# Patient Record
Sex: Female | Born: 1950 | Race: Black or African American | Hispanic: No | Marital: Married | Smoking: Former smoker
Health system: Southern US, Community
[De-identification: ages and names within clinical notes are randomized; demographics above are authoritative.]

## PROBLEM LIST (undated history)

## (undated) DIAGNOSIS — I10 Essential (primary) hypertension: Secondary | ICD-10-CM

## (undated) DIAGNOSIS — E785 Hyperlipidemia, unspecified: Secondary | ICD-10-CM

## (undated) DIAGNOSIS — M25519 Pain in unspecified shoulder: Secondary | ICD-10-CM

## (undated) HISTORY — DX: Pain in unspecified shoulder: M25.519

## (undated) HISTORY — PX: TUBAL LIGATION: SHX77

## (undated) HISTORY — DX: Essential (primary) hypertension: I10

## (undated) HISTORY — DX: Hyperlipidemia, unspecified: E78.5

---

## 2013-06-06 ENCOUNTER — Telehealth (HOSPITAL_COMMUNITY): Payer: Self-pay | Admitting: *Deleted

## 2013-06-21 ENCOUNTER — Telehealth (HOSPITAL_COMMUNITY): Payer: Self-pay | Admitting: *Deleted

## 2013-07-05 ENCOUNTER — Telehealth (HOSPITAL_COMMUNITY): Payer: Self-pay | Admitting: *Deleted

## 2013-07-26 ENCOUNTER — Telehealth (HOSPITAL_COMMUNITY): Payer: Self-pay | Admitting: *Deleted

## 2013-09-15 ENCOUNTER — Encounter: Payer: Self-pay | Admitting: Cardiovascular Disease

## 2013-09-15 ENCOUNTER — Ambulatory Visit (INDEPENDENT_AMBULATORY_CARE_PROVIDER_SITE_OTHER): Payer: Self-pay | Admitting: Cardiovascular Disease

## 2013-09-15 VITALS — BP 126/77 | HR 90 | Ht 67.0 in | Wt 223.0 lb

## 2013-09-15 DIAGNOSIS — R079 Chest pain, unspecified: Secondary | ICD-10-CM

## 2013-09-15 DIAGNOSIS — R011 Cardiac murmur, unspecified: Secondary | ICD-10-CM

## 2013-09-15 DIAGNOSIS — R0989 Other specified symptoms and signs involving the circulatory and respiratory systems: Secondary | ICD-10-CM

## 2013-09-15 DIAGNOSIS — I10 Essential (primary) hypertension: Secondary | ICD-10-CM

## 2013-09-15 DIAGNOSIS — I119 Hypertensive heart disease without heart failure: Secondary | ICD-10-CM

## 2013-09-15 DIAGNOSIS — E669 Obesity, unspecified: Secondary | ICD-10-CM

## 2013-09-15 DIAGNOSIS — E785 Hyperlipidemia, unspecified: Secondary | ICD-10-CM

## 2013-09-15 DIAGNOSIS — R0609 Other forms of dyspnea: Secondary | ICD-10-CM

## 2013-09-15 NOTE — Patient Instructions (Addendum)
Your physician has requested that you have an echocardiogram. Echocardiography is a painless test that uses sound waves to create images of your heart. It provides your doctor with information about the size and shape of your heart and how well your heart's chambers and valves are working. This procedure takes approximately one hour. There are no restrictions for this procedure.  Your physician has requested that you have a lexiscan myoview. For further information please visit [email or web address]. Please follow instruction sheet, as given.   Your physician recommends that you return for lab work fasting.  Your physician has recommended you make the following change in your medication: start taking (1) 81 mg  aspirin daily.   Your physician recommends that you schedule a follow-up appointment in: 8 weeks

## 2013-09-15 NOTE — Progress Notes (Signed)
Patient ID: Vickie Padilla, female   DOB: 03/08/1951, 63 y.o.   MRN: [id number]     PATIENT PROFILE: Vickie Padilla is a 63 y.o. female who is referred through the courtesy of Dr. Lerry Linerwight Williams, for evaluation of left shoulder, arm, chest discomfort, as well as exertional shortness of breath, hypertension, and hyperlipidemia.   HPI:  Vickie Padilla is a 63 y.o. female who is originally from [place]  She admits to at least a 20 year history of hypertension.  Most recently she has been on amlodipine 5 mg, HCTZ 12.5 mg, and lisinopril 10 mg for blood pressure control.  She also has a history of hyperlipidemia and has been on simvastatin 20 mg.  Recently, she's developed left-sided chest shoulder, neck discomfort.  This can occur at any time, but also with activity.  She has noticed a change in her exercise tolerance with shortness of breath.  She does walk with a cane.  She is now referred for cardiology evaluation.  Family history is notable for hypertension and diabetes mellitus.  Past Medical History  Diagnosis Date  . Hyperlipidemia   . Hypertension   . Shoulder pain     Past Surgical History  Procedure Laterality Date  . Tubal ligation      No Known Allergies  Current Outpatient Prescriptions  Medication Sig Dispense Refill  . amLODipine (NORVASC) 5 MG tablet Take 5 mg by mouth daily.      . cyclobenzaprine (FLEXERIL) 10 MG tablet Take 10 mg by mouth 3 (three) times daily as needed for muscle spasms.      . hydrochlorothiazide (MICROZIDE) 12.5 MG capsule Take 12.5 mg by mouth daily.      Marland Kitchen. lisinopril (PRINIVIL,ZESTRIL) 10 MG tablet Take 10 mg by mouth daily.      Marland Kitchen. NAPROXEN PO Take by mouth. As directed      . simvastatin (ZOCOR) 20 MG tablet Take 20 mg by mouth daily.       No current facility-administered medications for this visit.    Socially, she is from [place].  She's been married for 39 years.  She has 4 children.  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  She does drink  occasional beer and wine.  She quit tobacco one year ago.  Family History  Problem Relation Age of Onset  . Diabetes Mother   . Hypertension Father     ROS General: Negative; No fevers, chills, or night sweats.  Positive for obesity HEENT: Negative; No changes in vision or hearing, sinus congestion, difficulty swallowing Pulmonary: Negative; No cough, wheezing, shortness of breath, hemoptysis Cardiovascular:  See HPI; No, presyncope, syncope, palpitations, edema GI: Negative; No nausea, vomiting, diarrhea, or abdominal pain GU: Negative; No dysuria, hematuria, or difficulty voiding Musculoskeletal: Negative; no myalgias, joint pain, or weakness Hematologic/Oncologic: Negative; no easy bruising, bleeding Endocrine: Negative; no heat/cold intolerance; no diabetes Neuro: Negative; no changes in balance, headaches Skin: Negative; No rashes or skin lesions Psychiatric: Negative; No behavioral problems, depression Sleep: Negative; No daytime sleepiness, hypersomnolence, bruxism, restless legs, hypnogagnic hallucinations Other comprehensive 14 point system review is negative   Physical Exam BP 126/77  Pulse 90  Ht 5\' 7"  (1.702 m)  Wt 223 lb (101.152 kg)  BMI 34.92 kg/m2 General: Alert, oriented, no distress.  Skin: normal turgor, no rashes, warm and dry HEENT: Normocephalic, atraumatic. Pupils equal round and reactive to light; sclera anicteric; extraocular muscles intact; There is arcus senilis bilaterally. Fundi with mild arteriolar narrowing.  There are no  hemorrhages or exudates. Nose without nasal septal hypertrophy Mouth/Parynx benign; Mallinpatti scale 2/3 Neck: No JVD, no carotid bruits; normal carotid upstroke Lungs: clear to ausculatation and percussion; no wheezing or rales Chest wall: without tenderness to palpitation Heart: PMI not displaced, RRR, s1 s2 normal, 2/6 systolic murmur along the left sternal border, as well as at the apex, no diastolic murmur, no rubs,  gallops, thrills, or heaves Abdomen: Moderate central adiposity soft, nontender; no hepatosplenomehaly, BS+; abdominal aorta nontender and not dilated by palpation. Back: no CVA tenderness Pulses 2+ Musculoskeletal: full range of motion, normal strength, no joint deformities Extremities: no clubbing cyanosis or edema, Homan's sign negative  Neurologic: grossly nonfocal; Cranial nerves grossly wnl Psychologic: Normal mood and affect   ECG (independently read by me): Normal sinus rhythm, probable biatrial enlargement, left ventricular hypertrophy by voltage criteria.  No significant ST-T changes.  LABS:  BMET No results found for this basename: na, k, cl, co2, glucose, bun, creatinine, calcium, gfrnonaa, gfraa     Hepatic Function Panel  No results found for this basename: prot, albumin, ast, alt, alkphos, bilitot, bilidir, ibili     CBC No results found for this basename: wbc, rbc, hgb, hct, plt, mcv, mch, mchc, rdw, neutrabs, lymphsabs, monoabs, eosabs, basosabs     BNP No results found for this basename: probnp    Lipid Panel  No results found for this basename: chol, trig, hdl, cholhdl, vldl, ldlcalc      RADIOLOGY: No results found.   ASSESSMENT AND PLAN: [name] is a 63 year old [place] female, who has a greater than 20 year history of hypertension, and currently is on a 3 drug medical regimen.  Her blood pressure and when initially taken by the nurse was 126/77.  Blood pressure when taken by me was 148/75 supine and was 140/70 standing.  Her resting pulse is 90.  She has a history of hyperlipidemia.  Recently, she has noticed more shortness of breath, particularly in the morning with activity.  She also is experienced left shoulder, neck, and chest discomfort.  I am recommending she undergo laboratory consisting of a CBC, CMP, lipid panel, thyroid studies.  I am scheduling her for an echo Doppler study to evaluate her cardiac murmur as well as systolic  and diastolic function.  I suspect she does have diastolic dysfunction based on her long-standing hypertensive history, which may be playing a role in some of her dyspnea. She will also undergo a LexiScan Myoview to make certain her dyspnea and left shoulder, neck, and chest discomfort is not ischemia mediated.  She walks with a cane and would not be able to do treadmill testing.  I will see her back in the office in followup of the above tests and further recommendations will be made at that time.   Lennette Biharihomas A. Kelly, MD, [facility] 09/15/2013 11:47 AM

## 2013-09-19 LAB — CBC
HEMATOCRIT: 37.3 % (ref 36.0–46.0)
HEMOGLOBIN: 12.2 g/dL (ref 12.0–15.0)
MCH: 29.3 pg (ref 26.0–34.0)
MCHC: 32.7 g/dL (ref 30.0–36.0)
MCV: 89.4 fL (ref 78.0–100.0)
Platelets: 210 10*3/uL (ref 150–400)
RBC: 4.17 MIL/uL (ref 3.87–5.11)
RDW: 13.5 % (ref 11.5–15.5)
WBC: 5.9 10*3/uL (ref 4.0–10.5)

## 2013-09-20 ENCOUNTER — Encounter: Payer: Self-pay | Admitting: *Deleted

## 2013-09-20 LAB — LIPID PANEL
Cholesterol: 144 mg/dL (ref 0–200)
HDL: 49 mg/dL (ref >39)
LDL Cholesterol: 82 mg/dL (ref 0–99)
TRIGLYCERIDES: 66 mg/dL (ref <150)
Total CHOL/HDL Ratio: 2.9 Ratio
VLDL: 13 mg/dL (ref 0–40)

## 2013-09-20 LAB — COMPREHENSIVE METABOLIC PANEL
ALT: 31 U/L (ref 0–35)
AST: 26 U/L (ref 0–37)
Albumin: 4.3 g/dL (ref 3.5–5.2)
Alkaline Phosphatase: 47 U/L (ref 39–117)
BUN: 15 mg/dL (ref 6–23)
CO2: 26 meq/L (ref 19–32)
Calcium: 8.8 mg/dL (ref 8.4–10.5)
Chloride: 105 mEq/L (ref 96–112)
Creat: 0.55 mg/dL (ref 0.50–1.10)
GLUCOSE: 103 mg/dL — AB (ref 70–99)
POTASSIUM: 4.7 meq/L (ref 3.5–5.3)
Sodium: 138 mEq/L (ref 135–145)
TOTAL PROTEIN: 6.9 g/dL (ref 6.0–8.3)
Total Bilirubin: 0.4 mg/dL (ref 0.2–1.2)

## 2013-09-20 LAB — TSH: TSH: 1.907 u[IU]/mL (ref 0.350–4.500)

## 2013-10-04 ENCOUNTER — Telehealth (HOSPITAL_COMMUNITY): Payer: Self-pay

## 2013-10-04 NOTE — Telephone Encounter (Signed)
Encounter complete. 

## 2013-10-05 ENCOUNTER — Telehealth (HOSPITAL_COMMUNITY): Payer: Self-pay

## 2013-10-05 NOTE — Telephone Encounter (Signed)
Encounter complete. 

## 2013-10-06 ENCOUNTER — Ambulatory Visit (HOSPITAL_COMMUNITY)
Admission: RE | Admit: 2013-10-06 | Discharge: 2013-10-06 | Disposition: A | Discharge status: Home / Self Care | Payer: Self-pay | Source: Ambulatory Visit | Attending: Cardiology | Admitting: Cardiology

## 2013-10-06 DIAGNOSIS — R079 Chest pain, unspecified: Secondary | ICD-10-CM | POA: Insufficient documentation

## 2013-10-06 DIAGNOSIS — R011 Cardiac murmur, unspecified: Secondary | ICD-10-CM | POA: Insufficient documentation

## 2013-10-06 DIAGNOSIS — I10 Essential (primary) hypertension: Secondary | ICD-10-CM | POA: Insufficient documentation

## 2013-10-06 MED ORDER — TECHNETIUM TC 99M SESTAMIBI GENERIC - CARDIOLITE
10.8000 | Freq: Once | INTRAVENOUS | Status: AC | PRN
  Administered 2013-10-06: 11 via INTRAVENOUS

## 2013-10-06 MED ORDER — TECHNETIUM TC 99M SESTAMIBI GENERIC - CARDIOLITE
31.0000 | Freq: Once | INTRAVENOUS | Status: AC | PRN
  Administered 2013-10-06: 31 via INTRAVENOUS

## 2013-10-06 MED ORDER — REGADENOSON 0.4 MG/5ML IV SOLN
0.4000 mg | Freq: Once | INTRAVENOUS | Status: AC
  Administered 2013-10-06: 0.4 mg via INTRAVENOUS

## 2013-10-06 NOTE — Procedures (Addendum)
[facility] [address] [place] [address] [phone]  Cardiology Nuclear Med Study  Jimmye NormanOlive Padilla is a 63 y.o. female     MRN : [id number]     DOB: 06/05/1950  Procedure Date: 10/06/2013  Nuclear Med Background Indication for Stress Test:  Evaluation for Ischemia and Abnormal EKG History:  murmur;No prior respiratory history reported;No prior NUC MPI for comparison. Cardiac Risk Factors: History of Smoking, Hypertension, Lipids and Obesity  Symptoms:  Chest Pain, Dizziness, DOE, Light-Headedness, Palpitations and Left sided shoulder pain and neck pain.   Nuclear Pre-Procedure Caffeine/Decaff Intake:  9:00pm NPO After: 7:00am   IV Site: R Hand  IV 0.9% NS with Angio Cath:  22g  Chest Size (in):  n/a IV Started by: Berdie OgrenAmanda Wease, RN  Height: 5\' 7"  (1.702 m)  Cup Size: C  BMI:  Body mass index is 34.92 kg/(m^2). Weight:  223 lb (101.152 kg)   Tech Comments:  n/a    Nuclear Med Study 1 or 2 day study: 1 day  Stress Test Type:  Lexiscan  Order Authorizing Provider:  Olena Materhoams Kelly, MD   Resting Radionuclide: Technetium 7279m Sestamibi  Resting Radionuclide Dose: 10.8 mCi   Stress Radionuclide:  Technetium 5879m Sestamibi  Stress Radionuclide Dose: 31.0 mCi           Stress Protocol Rest HR: 64 Stress HR: 79  Rest BP: 181/114 Stress BP: 185/100  Exercise Time (min): n/a METS: n/a          Dose of Adenosine (mg):  n/a Dose of Lexiscan: 0.4 mg  Dose of Atropine (mg): n/a Dose of Dobutamine: n/a mcg/kg/min (at max HR)  Stress Test Technologist: Ernestene MentionGwen Farrington, CCT Nuclear Technologist: Gonzella LexPam Phillips, CNMT   Rest Procedure:  Myocardial perfusion imaging was performed at rest 45 minutes following the intravenous administration of Technetium 4479m Sestamibi. Stress Procedure:  The patient received IV Lexiscan 0.4 mg over 15-seconds.  Technetium 2179m Sestamibi injected Iv at 30-seconds.  There were no significant changes with  Lexiscan.  Quantitative spect images were obtained after a 45 minute delay.  Transient Ischemic Dilatation (Normal <1.22):  1.32 QGS EDV:  108 ml QGS ESV:  8 ml LV Ejection Fraction: 65%      Rest ECG: NSR - Normal EKG  Stress ECG: No significant change from baseline ECG  QPS Raw Data Images:  There is a breast shadow that accounts for the anterior attenuation. Stress Images:  There is decreased uptake in the anterior wall. Rest Images:  Comparison with the stress images reveals no significant change. Subtraction (SDS):  There is a fixed anteriour defect that is most consistent with breast attenuation. LV Wall Motion:  NL LV Function; NL Wall Motion  Impression Exercise Capacity:  Lexiscan with no exercise. BP Response:  Normal blood pressure response. Clinical Symptoms:  No significant symptoms noted. ECG Impression:  No significant ST segment change suggestive of ischemia. Comparison with Prior Nuclear Study: No previous nuclear study performed   Overall Impression:  Low risk stress nuclear study with anterior wall breast attenuation artifact.Thurmon Fair.   Kayliah Tindol, MD  10/06/2013 1:20 PM

## 2013-10-06 NOTE — Progress Notes (Signed)
2D Echocardiogram Complete.  10/06/2013   Renesmay Nesbitt, RDCS  

## 2013-10-07 NOTE — Progress Notes (Signed)
LMTCB

## 2013-10-13 ENCOUNTER — Encounter: Payer: Self-pay | Admitting: *Deleted

## 2013-11-02 NOTE — Addendum Note (Signed)
Addended byGaynelle Cage: WADDELL, WANDA M. on: 11/02/2013 12:02 PM   Modules accepted: Orders

## 2013-11-29 ENCOUNTER — Ambulatory Visit: Payer: Self-pay | Admitting: Cardiovascular Disease

## 2018-05-25 ENCOUNTER — Emergency Department (HOSPITAL_COMMUNITY): Payer: Self-pay

## 2018-05-25 ENCOUNTER — Encounter (HOSPITAL_COMMUNITY): Payer: Self-pay

## 2018-05-25 ENCOUNTER — Emergency Department (HOSPITAL_COMMUNITY)
Admission: EM | Admit: 2018-05-25 | Discharge: 2018-05-25 | Disposition: A | Discharge status: Home / Self Care | Payer: Self-pay | Attending: Emergency Medicine | Admitting: Emergency Medicine

## 2018-05-25 ENCOUNTER — Other Ambulatory Visit: Payer: Self-pay

## 2018-05-25 DIAGNOSIS — I10 Essential (primary) hypertension: Secondary | ICD-10-CM | POA: Insufficient documentation

## 2018-05-25 DIAGNOSIS — R42 Dizziness and giddiness: Secondary | ICD-10-CM | POA: Insufficient documentation

## 2018-05-25 DIAGNOSIS — R9389 Abnormal findings on diagnostic imaging of other specified body structures: Secondary | ICD-10-CM | POA: Insufficient documentation

## 2018-05-25 DIAGNOSIS — Z87891 Personal history of nicotine dependence: Secondary | ICD-10-CM | POA: Insufficient documentation

## 2018-05-25 DIAGNOSIS — Z7982 Long term (current) use of aspirin: Secondary | ICD-10-CM | POA: Insufficient documentation

## 2018-05-25 DIAGNOSIS — Z79899 Other long term (current) drug therapy: Secondary | ICD-10-CM | POA: Insufficient documentation

## 2018-05-25 LAB — BASIC METABOLIC PANEL
Anion gap: 11 (ref 5–15)
BUN: 17 mg/dL (ref 8–23)
CO2: 24 mmol/L (ref 22–32)
Calcium: 9.7 mg/dL (ref 8.9–10.3)
Chloride: 102 mmol/L (ref 98–111)
Creatinine, Ser: 0.8 mg/dL (ref 0.44–1.00)
GFR calc Af Amer: >60 mL/min (ref >60)
GFR calc non Af Amer: >60 mL/min (ref >60)
Glucose, Bld: 113 mg/dL — ABNORMAL HIGH (ref 70–99)
Potassium: 3.5 mmol/L (ref 3.5–5.1)
Sodium: 137 mmol/L (ref 135–145)

## 2018-05-25 LAB — CBC
HCT: 41.5 % (ref 36.0–46.0)
Hemoglobin: 13.4 g/dL (ref 12.0–15.0)
MCH: 29.6 pg (ref 26.0–34.0)
MCHC: 32.3 g/dL (ref 30.0–36.0)
MCV: 91.8 fL (ref 80.0–100.0)
Platelets: 206 10*3/uL (ref 150–400)
RBC: 4.52 MIL/uL (ref 3.87–5.11)
RDW: 12.7 % (ref 11.5–15.5)
WBC: 7.7 10*3/uL (ref 4.0–10.5)
nRBC: 0 % (ref 0.0–0.2)

## 2018-05-25 LAB — I-STAT TROPONIN, ED: Troponin i, poc: 0.01 ng/mL (ref 0.00–0.08)

## 2018-05-25 MED ORDER — SODIUM CHLORIDE 0.9% FLUSH: 3.0000 mL | Freq: Once | INTRAVENOUS | Status: DC

## 2018-05-25 NOTE — ED Provider Notes (Addendum)
[facility]-EMERGENCY DEPT Provider Note   CSN: [id number] Arrival date & time: 05/25/18  1738    History   Chief Complaint Chief Complaint  Patient presents with  . Chest Pain  . Dizziness    HPI Vickie Padilla Justus is a 68 y.o. female.     HPI   She presents for evaluation of periods of "blackout," which have been occurring frequently for the last several days.  Also she has had similar problems for several months, on and off approximately once per week.  She has been evaluated by her PCP who was concerned about cardiac problems, and sent her here for evaluation.  Patient states that she has never fallen because of the spells.  She states that she can feel them coming on and leans against something or holds onto something, to prevent herself from falling.  She has a funny feeling in the left side of her head, near the back when the spells occur.  She also feels like her legs do not support her during these times.  One time, she remembers standing, waiting for a bus, then leaning on a sign at the bus stop.  When the bus came she was unable to move or get onto the bus.  She denies persistent headache.  She denies fever, chills, cough, shortness of breath, chest pain, palpitations, focal weakness or paresthesia.  She occasionally has pain in both hands for which she has been treated with gabapentin.  She denies neck or back pain.  There are no other known modifying factors.  Past Medical History:  Diagnosis Date  . Hyperlipidemia   . Hypertension   . Shoulder pain     Patient Active Problem List   Diagnosis Date Noted  . Essential hypertension 09/15/2013  . Hypertensive heart disease 09/15/2013  . Hyperlipidemia 09/15/2013  . Mild obesity 09/15/2013  . Chest pain 09/15/2013  . Exertional dyspnea 09/15/2013    Past Surgical History:  Procedure Laterality Date  . TUBAL LIGATION       OB History   No obstetric history on file.      Home Medications     Prior to Admission medications   Medication Sig Start Date End Date Taking? Authorizing Provider  amLODipine (NORVASC) 10 MG tablet Take 10 mg by mouth daily.    Yes [provider]  aspirin 81 MG tablet Take 81 mg by mouth daily.   Yes [provider]  Cyanocobalamin (VITAMIN B 12 PO) Take 1 tablet by mouth daily.   Yes [provider]  gabapentin (NEURONTIN) 400 MG capsule Take 400 mg by mouth 3 (three) times daily.   Yes [provider]  lisinopril-hydrochlorothiazide (PRINZIDE,ZESTORETIC) 20-25 MG tablet Take 1 tablet by mouth daily.   Yes [provider]    Family History Family History  Problem Relation Age of Onset  . Diabetes Mother   . Hypertension Father     Social History Social History   Tobacco Use  . Smoking status: Former Smoker    Packs/day: 2.00    Years: 6.00    Pack years: 12.00    Types: Cigarettes  . Smokeless tobacco: Never Used  Substance Use Topics  . Alcohol use: Yes    Comment: occasionl wine or beer  . Drug use: Not on file     Allergies   Patient has no known allergies.   Review of Systems Review of Systems  All other systems reviewed and are negative.    Physical  Exam Updated Vital Signs BP (!) 177/100   Pulse 95   Temp 98.8 F (37.1 C) (Oral)   Resp 18   Ht 5\' 8"  (1.727 m)   Wt 99.8 kg   SpO2 100%   BMI 33.45 kg/m   Physical Exam Vitals signs and nursing note reviewed.  Constitutional:      General: She is not in acute distress.    Appearance: She is well-developed. She is not ill-appearing, toxic-appearing or diaphoretic.  HENT:     Head: Normocephalic and atraumatic.     Right Ear: External ear normal.     Left Ear: External ear normal.  Eyes:     Conjunctiva/sclera: Conjunctivae normal.     Pupils: Pupils are equal, round, and reactive to light.  Neck:     Musculoskeletal: Normal range of motion and neck supple.     Trachea: Phonation normal.  Cardiovascular:      Rate and Rhythm: Normal rate and regular rhythm.     Heart sounds: Normal heart sounds.  Pulmonary:     Effort: Pulmonary effort is normal. No respiratory distress.     Breath sounds: Normal breath sounds. No stridor.  Abdominal:     Palpations: Abdomen is soft.     Tenderness: There is no abdominal tenderness.  Musculoskeletal: Normal range of motion.  Skin:    General: Skin is warm and dry.  Neurological:     Mental Status: She is alert and oriented to person, place, and time.     Cranial Nerves: No cranial nerve deficit.     Sensory: No sensory deficit.     Motor: No abnormal muscle tone.     Coordination: Coordination normal.     Comments: No dysarthria, aphasia or nystagmus.  Negative test of skew.  Normal head impulse testing.  No pronator drift.  No ataxia.  Psychiatric:        Mood and Affect: Mood normal.        Behavior: Behavior normal.        Thought Content: Thought content normal.        Judgment: Judgment normal.      ED Treatments / Results  Labs (all labs ordered are listed, but only abnormal results are displayed) Labs Reviewed  BASIC METABOLIC PANEL - Abnormal; Notable for the following components:      Result Value   Glucose, Bld 113 (*)    All other components within normal limits  CBC  I-STAT TROPONIN, ED    EKG EKG Interpretation  Date/Time:  Tuesday May 25 2018 18:09:57 EST Ventricular Rate:  95 PR Interval:    QRS Duration: 87 QT Interval:  359 QTC Calculation: 452 R Axis:   69 Text Interpretation:  Sinus rhythm RAE, consider biatrial enlargement Minimal ST elevation, anterior leads No old tracing to compare Confirmed by Mancel Bale [phone]) on 05/25/2018 6:53:11 PM   Radiology Dg Chest 2 View  Result Date: 05/25/2018 CLINICAL DATA:  Chest pain and dizziness for a month. EXAM: CHEST - 2 VIEW COMPARISON:  None. FINDINGS: Cardiomediastinal silhouette is normal. RIGHT apical masslike density, however trachea is mildly shifted to the RIGHT.  No pleural effusion. No pneumothorax. Soft tissue planes and included osseous structures are non suspicious. IMPRESSION: RIGHT apical lung density seen with substernal extent of thyroid, tortuous vessel, adenopathy or mass. Recommend CT chest with contrast. Electronically Signed   By: Awilda Metro M.D.   On: 05/25/2018 19:11   Mr Brain Wo Contrast  Result Date:  05/25/2018 CLINICAL DATA:  68 y/o F; persistent vertigo and dizziness. One month of chest pains. Evaluate for possible CVA. EXAM: MRI HEAD WITHOUT CONTRAST TECHNIQUE: Multiplanar, multiecho pulse sequences of the brain and surrounding structures were obtained without intravenous contrast. COMPARISON:  None. FINDINGS: Brain: No acute infarction, hemorrhage, hydrocephalus, extra-axial collection or mass lesion. Few punctate foci of T2 FLAIR hyperintensity in frontal white matter of unlikely clinical significance given age. Mild volume loss of the brain. Vascular: Normal flow voids. Skull and upper cervical spine: Normal marrow signal. Sinuses/Orbits: Negative. Other: None. IMPRESSION: 1. No acute intracranial abnormality identified. 2. Mild volume loss of the brain. Electronically Signed   By: Mitzi Hansen M.D.   On: 05/25/2018 21:28    Procedures Procedures (including critical care time)  Medications Ordered in ED Medications  sodium chloride flush (NS) 0.9 % injection 3 mL (has no administration in time range)     Initial Impression / Assessment and Plan / ED Course  I have reviewed the triage vital signs and the nursing notes.  Pertinent labs & imaging results that were available during my care of the patient were reviewed by me and considered in my medical decision making (see chart for details).         Patient Vitals for the past 24 hrs:  BP Temp Temp src Pulse Resp SpO2 Height Weight  05/25/18 1800 (!) 177/100 98.8 F (37.1 C) Oral 95 18 100 % 5\' 8"  (1.727 m) 99.8 kg    9:37 PM Reevaluation with update and  discussion. After initial assessment and treatment, an updated evaluation reveals no change in clinical status.  Findings discussed with the patient and all questions were answered. Mancel Bale   Medical Decision Making: Nonspecific episodes of difficulty walking, and feeling faint.  Patient describes her symptoms as "blackouts and dizziness."  Screen evaluation today is reassuring.  No evidence for posterior circulation stroke, brain tumor, acute cardiac abnormality or metabolic instability.  Incidental note of unspecified left apical lung mass versus tortuous vessels.  This can be evaluated as an outpatient, by her PCP, she has a PCP currently.  There is no indication for further ED treatment or hospitalization, at this time.  CRITICAL CARE-no Performed by: Mancel Bale  Nursing Notes Reviewed/ Care Coordinated Applicable Imaging Reviewed Interpretation of Laboratory Data incorporated into ED treatment  The patient appears reasonably screened and/or stabilized for discharge and I doubt any other medical condition or other [facility] requiring further screening, evaluation, or treatment in the ED at this time prior to discharge.  Plan: Home Medications-continue current medications; Home Treatments-rest, fluids; return here if the recommended treatment, does not improve the symptoms; Recommended follow up-DC PA follow-up 1 week, neurology follow-up regarding episodes of decreased responsiveness.   Final Clinical Impressions(s) / ED Diagnoses   Final diagnoses:  Dizziness  Abnormal chest x-ray    ED Discharge Orders    None       Mancel Bale, MD 05/25/18 2142    Mancel Bale, MD 05/26/18 [id number]

## 2018-05-25 NOTE — Discharge Instructions (Signed)
Follow-up with your PCP for further care and treatment and to get a CT scan of the chest done.  Call the neurology office to follow-up about the spells which you have been having.

## 2018-05-25 NOTE — ED Triage Notes (Signed)
Pt arrives via POV from home. Pt reports chest pain and dizziness for 1 month with more frequent episodes occurring recently. Pt reports "blacking out" but not passing out.

## 2018-05-27 ENCOUNTER — Ambulatory Visit
Admission: RE | Admit: 2018-05-27 | Discharge: 2018-05-27 | Disposition: A | Discharge status: Home / Self Care | Payer: No Typology Code available for payment source | Source: Ambulatory Visit | Attending: Internal Medicine | Admitting: Internal Medicine

## 2018-05-27 ENCOUNTER — Other Ambulatory Visit: Payer: Self-pay | Admitting: Internal Medicine

## 2018-05-27 DIAGNOSIS — R9389 Abnormal findings on diagnostic imaging of other specified body structures: Secondary | ICD-10-CM

## 2020-01-21 IMAGING — MR MR HEAD W/O CM
8 of 10 series · 37 of 48 positions shown · non-contrast
Comparison: None.

CLINICAL DATA: 67 y/o F; persistent vertigo and dizziness. One
month of chest pains. Evaluate for possible CVA.

EXAM:
MRI HEAD WITHOUT CONTRAST
TECHNIQUE: Multiplanar, multiecho pulse sequences of the brain and surrounding
structures were obtained without intravenous contrast.

[Series 3: DWI · axial · 3.0mm · 1.09mm/px · z∈[-39,+108]mm · 8 of 100 slices shown (1 of 4)]
[im 1/100]
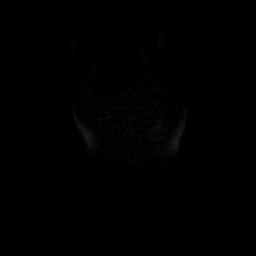
[im 12/100]
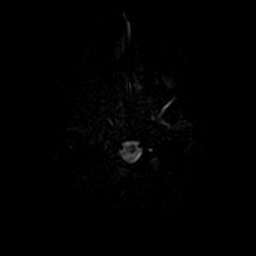
[im 34/100]
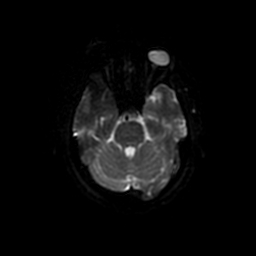
[im 45/100]
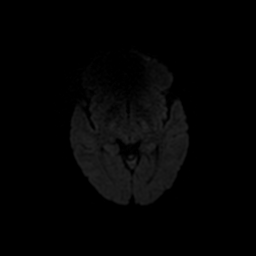
[im 56/100]
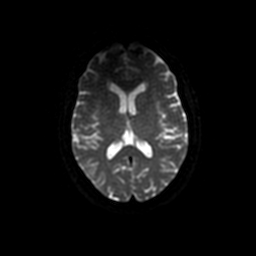
[im 67/100]
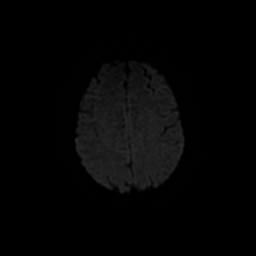
[im 89/100]
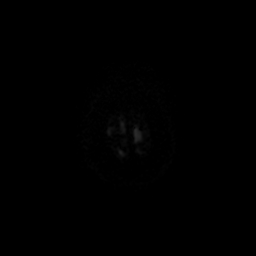
[im 100/100]
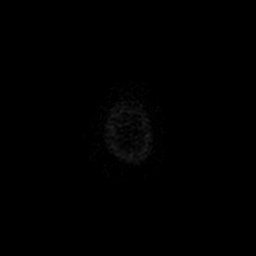

[Series 4: T1 · sagittal · 5.0mm · 0.47mm/px · 2 of 24 slices shown]
[im 1/24]
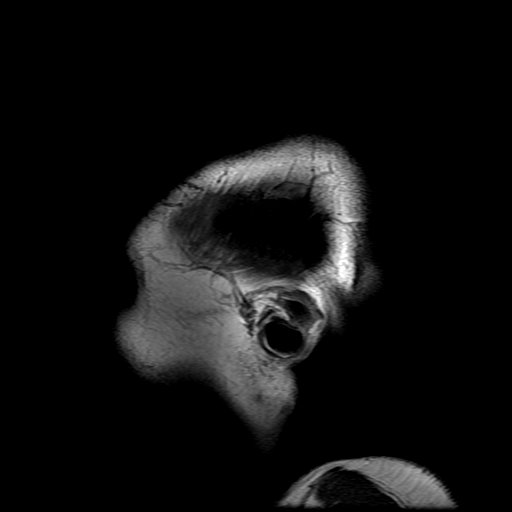
[im 12/24]
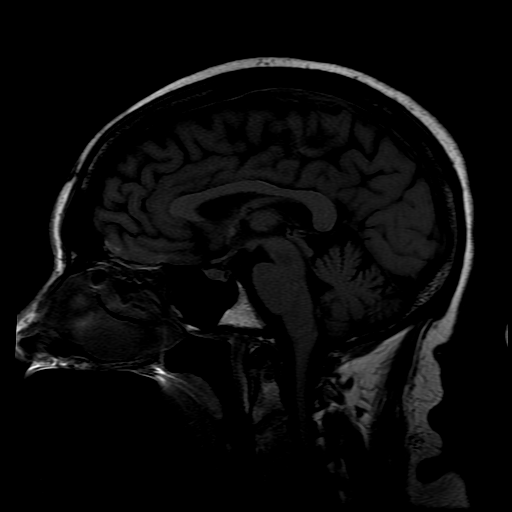

[Series 5: DWI · coronal · 4.0mm · 1.09mm/px · 9 of 84 slices shown (2 of 4)]
[im 1/84]
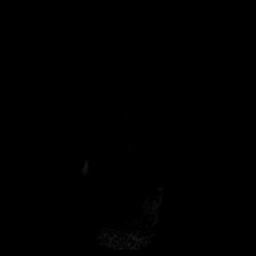
[im 11/84]
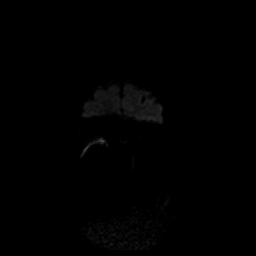
[im 21/84]
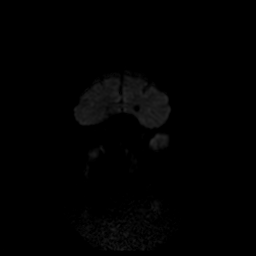
[im 32/84]
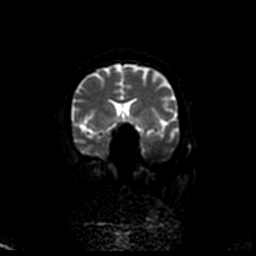
[im 42/84]
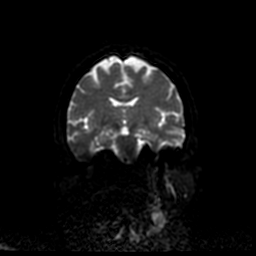
[im 52/84]
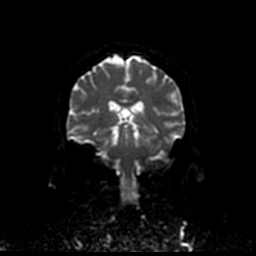
[im 63/84]
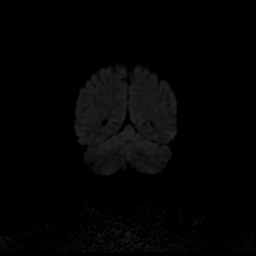
[im 73/84]
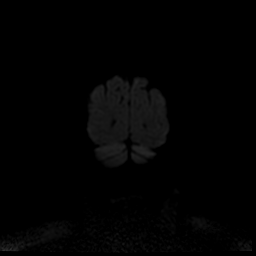
[im 84/84]
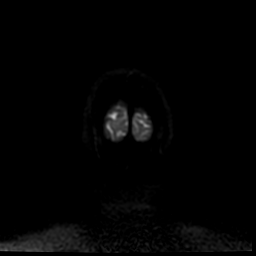

[Series 6: T2 · axial · 5.0mm · 0.43mm/px · z∈[-45,+105]mm · 3 of 26 slices shown (1 of 2)]
[im 1/26]
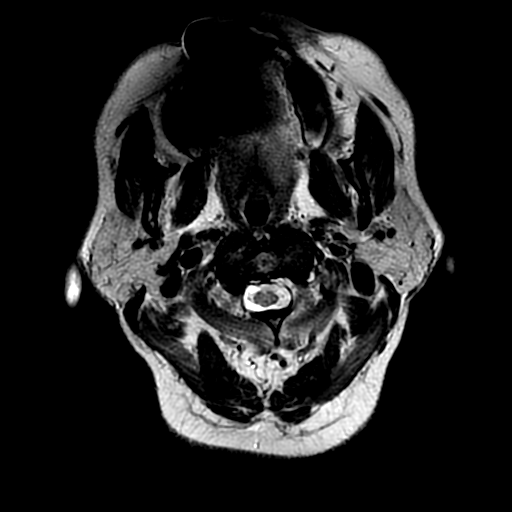
[im 13/26]
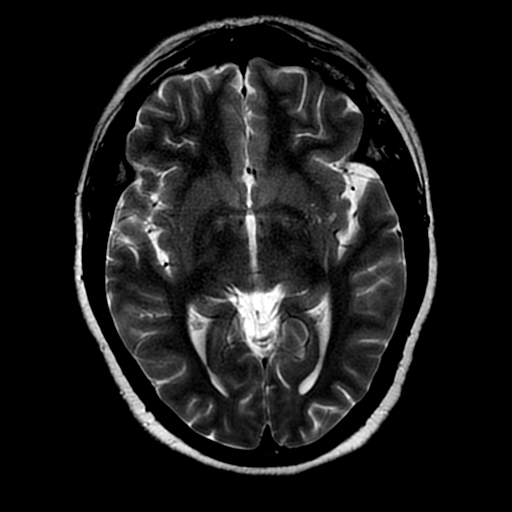
[im 26/26]
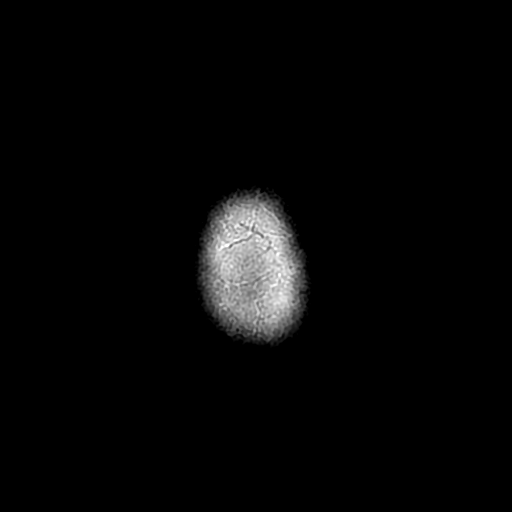

[Series 7: FLAIR · axial · 3.0mm · 0.43mm/px · z∈[-45,+105]mm · 3 of 26 slices shown]
[im 1/26]
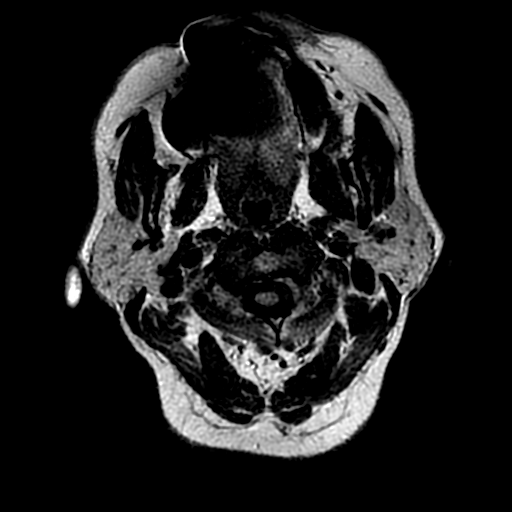
[im 13/26]
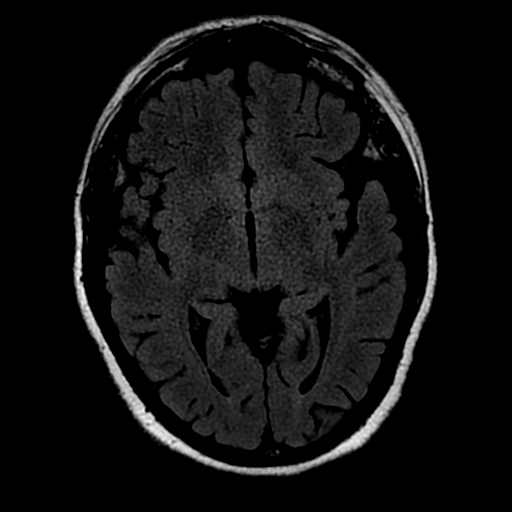
[im 26/26]
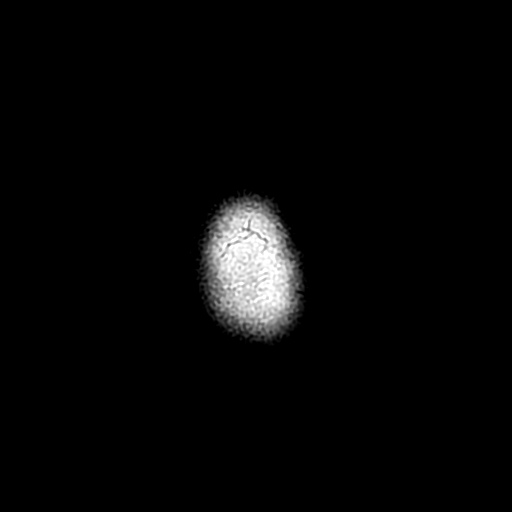

[Series 10: T2 · coronal · 5.0mm · 0.45mm/px · 3 of 28 slices shown (2 of 2)]
[im 1/28]
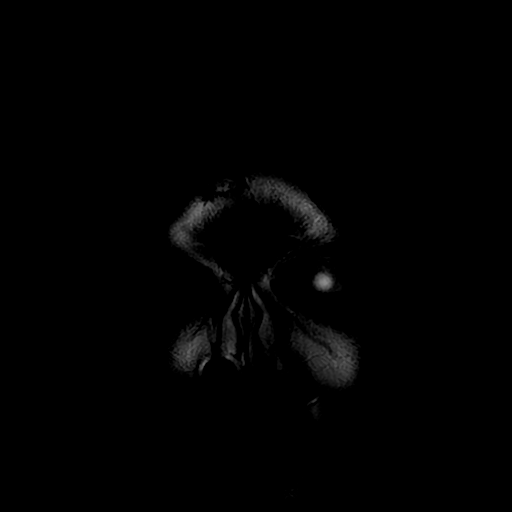
[im 14/28]
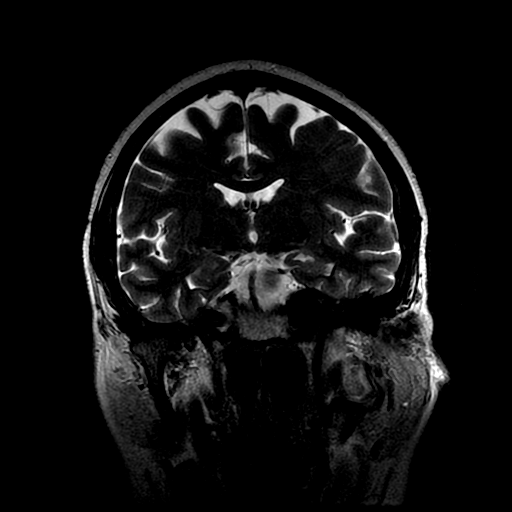
[im 28/28]
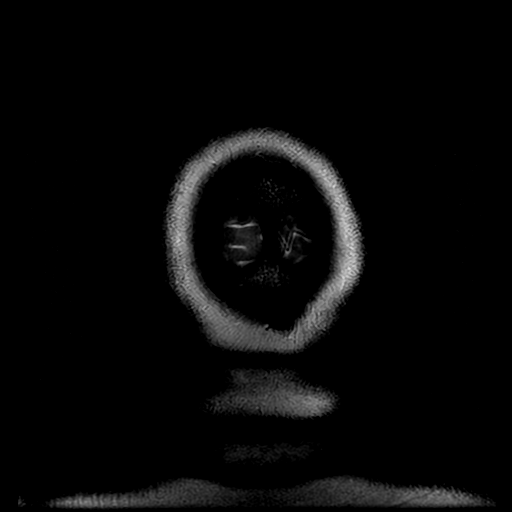

[Series 300: DWI · axial · 3.0mm · 1.09mm/px · z∈[-39,+108]mm · 5 of 50 slices shown (3 of 4)]
[im 1/50]
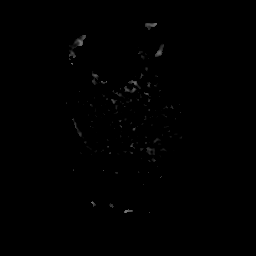
[im 13/50]
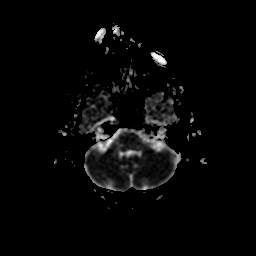
[im 25/50]
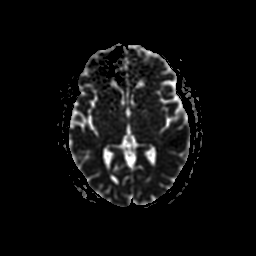
[im 37/50]
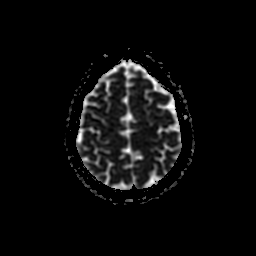
[im 50/50]
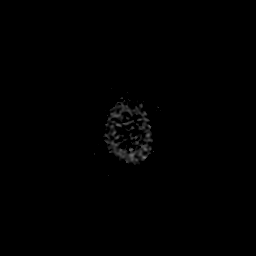

[Series 500: DWI · coronal · 4.0mm · 1.09mm/px · 4 of 42 slices shown (4 of 4)]
[im 1/42]
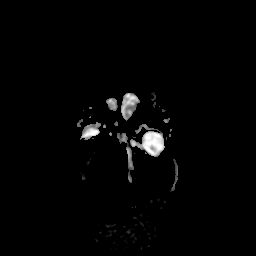
[im 14/42]
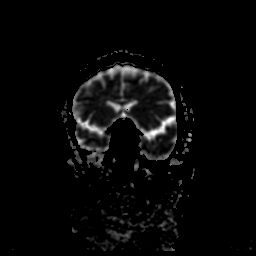
[im 28/42]
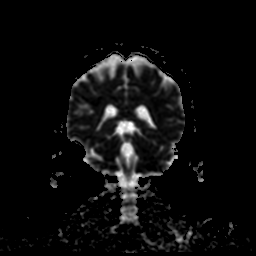
[im 42/42]
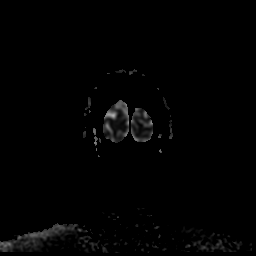

[37 of 48 positions shown; findings below may reference images not displayed]

FINDINGS: Brain: No acute infarction, hemorrhage, hydrocephalus, extra-axial
collection or mass lesion. Few punctate foci of T2 FLAIR
hyperintensity in frontal white matter of unlikely clinical
significance given age. Mild volume loss of the brain.

Vascular: Normal flow voids.

Skull and upper cervical spine: Normal marrow signal.

Sinuses/Orbits: Negative.

Other: None.
IMPRESSION: 1. No acute intracranial abnormality identified.
2. Mild volume loss of the brain.
